# Patient Record
Sex: Male | Born: 1990 | Race: Black or African American | Hispanic: No | Marital: Single
Health system: Southern US, Community
[De-identification: ages and names within clinical notes are randomized; demographics above are authoritative.]

## PROBLEM LIST (undated history)

## (undated) HISTORY — PX: KNEE SURGERY: SHX244

---

## 2014-04-17 ENCOUNTER — Emergency Department (HOSPITAL_COMMUNITY): Payer: Medicaid - Out of State

## 2014-04-17 ENCOUNTER — Emergency Department (HOSPITAL_COMMUNITY)
Admission: EM | Admit: 2014-04-17 | Discharge: 2014-04-17 | Disposition: A | Discharge status: Home / Self Care | Payer: Medicaid - Out of State | Attending: Emergency Medicine | Admitting: Emergency Medicine

## 2014-04-17 ENCOUNTER — Encounter (HOSPITAL_COMMUNITY): Payer: Self-pay | Admitting: *Deleted

## 2014-04-17 DIAGNOSIS — Y998 Other external cause status: Secondary | ICD-10-CM | POA: Insufficient documentation

## 2014-04-17 DIAGNOSIS — S8992XA Unspecified injury of left lower leg, initial encounter: Secondary | ICD-10-CM | POA: Insufficient documentation

## 2014-04-17 DIAGNOSIS — Y9389 Activity, other specified: Secondary | ICD-10-CM | POA: Diagnosis not present

## 2014-04-17 DIAGNOSIS — S0990XA Unspecified injury of head, initial encounter: Secondary | ICD-10-CM | POA: Diagnosis not present

## 2014-04-17 DIAGNOSIS — Y92410 Unspecified street and highway as the place of occurrence of the external cause: Secondary | ICD-10-CM | POA: Insufficient documentation

## 2014-04-17 DIAGNOSIS — S199XXA Unspecified injury of neck, initial encounter: Secondary | ICD-10-CM | POA: Diagnosis not present

## 2014-04-17 DIAGNOSIS — S4992XA Unspecified injury of left shoulder and upper arm, initial encounter: Secondary | ICD-10-CM | POA: Insufficient documentation

## 2014-04-17 MED ORDER — METHOCARBAMOL 500 MG PO TABS: 500.0000 mg | ORAL_TABLET | Freq: Two times a day (BID) | ORAL | Status: AC

## 2014-04-17 MED ORDER — OXYCODONE-ACETAMINOPHEN 5-325 MG PO TABS
2.0000 | ORAL_TABLET | Freq: Once | ORAL | Status: AC
  Administered 2014-04-17: 2 via ORAL
  Filled 2014-04-17: qty 2

## 2014-04-17 MED ORDER — OXYCODONE-ACETAMINOPHEN 5-325 MG PO TABS
1.0000 | ORAL_TABLET | Freq: Once | ORAL | Status: DC
  Filled 2014-04-17: qty 1

## 2014-04-17 MED ORDER — IBUPROFEN 800 MG PO TABS: 800.0000 mg | ORAL_TABLET | Freq: Three times a day (TID) | ORAL | Status: AC
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
## 2014-04-17 NOTE — ED Notes (Signed)
Patient transported to CT 
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
## 2014-04-17 NOTE — Discharge Instructions (Signed)
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
## 2014-04-17 NOTE — ED Notes (Signed)
Patient transported to X-ray 
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
